# Patient Record
Sex: Female | Born: 1985 | Hispanic: Yes | Marital: Married
Health system: Southern US, Community
[De-identification: ages and names within clinical notes are randomized; demographics above are authoritative.]

---

## 2019-10-12 ENCOUNTER — Encounter (HOSPITAL_COMMUNITY): Payer: Self-pay | Admitting: Emergency Medicine

## 2019-10-12 ENCOUNTER — Emergency Department (HOSPITAL_COMMUNITY)
Admission: EM | Admit: 2019-10-12 | Discharge: 2019-10-12 | Disposition: A | Discharge status: Home / Self Care | Payer: HRSA Program | Attending: Emergency Medicine | Admitting: Emergency Medicine

## 2019-10-12 ENCOUNTER — Emergency Department (HOSPITAL_COMMUNITY): Payer: HRSA Program

## 2019-10-12 ENCOUNTER — Other Ambulatory Visit: Payer: Self-pay

## 2019-10-12 DIAGNOSIS — B349 Viral infection, unspecified: Secondary | ICD-10-CM

## 2019-10-12 DIAGNOSIS — U071 COVID-19: Secondary | ICD-10-CM | POA: Diagnosis not present

## 2019-10-12 DIAGNOSIS — R05 Cough: Secondary | ICD-10-CM | POA: Diagnosis present

## 2019-10-12 LAB — COMPREHENSIVE METABOLIC PANEL
ALT: 49 U/L — ABNORMAL HIGH (ref 0–44)
AST: 34 U/L (ref 15–41)
Albumin: 4.2 g/dL (ref 3.5–5.0)
Alkaline Phosphatase: 69 U/L (ref 38–126)
Anion gap: 7 (ref 5–15)
BUN: 15 mg/dL (ref 6–20)
CO2: 21 mmol/L — ABNORMAL LOW (ref 22–32)
Calcium: 9.1 mg/dL (ref 8.9–10.3)
Chloride: 109 mmol/L (ref 98–111)
Creatinine, Ser: 0.48 mg/dL (ref 0.44–1.00)
GFR calc Af Amer: >60 mL/min (ref >60)
GFR calc non Af Amer: >60 mL/min (ref >60)
Glucose, Bld: 94 mg/dL (ref 70–99)
Potassium: 3.8 mmol/L (ref 3.5–5.1)
Sodium: 137 mmol/L (ref 135–145)
Total Bilirubin: 1.9 mg/dL — ABNORMAL HIGH (ref 0.3–1.2)
Total Protein: 8.4 g/dL — ABNORMAL HIGH (ref 6.5–8.1)

## 2019-10-12 LAB — RESPIRATORY PANEL BY RT PCR (FLU A&B, COVID)
Influenza A by PCR: NEGATIVE
Influenza B by PCR: NEGATIVE
SARS Coronavirus 2 by RT PCR: POSITIVE — AB

## 2019-10-12 LAB — CBC WITH DIFFERENTIAL/PLATELET
Abs Immature Granulocytes: 0.02 10*3/uL (ref 0.00–0.07)
Basophils Absolute: 0 10*3/uL (ref 0.0–0.1)
Basophils Relative: 0 %
Eosinophils Absolute: 0.1 10*3/uL (ref 0.0–0.5)
Eosinophils Relative: 1 %
HCT: 41 % (ref 36.0–46.0)
Hemoglobin: 12.9 g/dL (ref 12.0–15.0)
Immature Granulocytes: 0 %
Lymphocytes Relative: 12 %
Lymphs Abs: 1.2 10*3/uL (ref 0.7–4.0)
MCH: 26.8 pg (ref 26.0–34.0)
MCHC: 31.5 g/dL (ref 30.0–36.0)
MCV: 85.2 fL (ref 80.0–100.0)
Monocytes Absolute: 0.6 10*3/uL (ref 0.1–1.0)
Monocytes Relative: 7 %
Neutro Abs: 7.8 10*3/uL — ABNORMAL HIGH (ref 1.7–7.7)
Neutrophils Relative %: 80 %
Platelets: 328 10*3/uL (ref 150–400)
RBC: 4.81 MIL/uL (ref 3.87–5.11)
RDW: 15 % (ref 11.5–15.5)
WBC: 9.7 10*3/uL (ref 4.0–10.5)
nRBC: 0 % (ref 0.0–0.2)

## 2019-10-12 MED ORDER — ONDANSETRON 4 MG PO TBDP: ORAL_TABLET | ORAL | 0 refills | Status: AC

## 2019-10-12 MED ORDER — SODIUM CHLORIDE 0.9 % IV BOLUS (SEPSIS)
1000.0000 mL | Freq: Once | INTRAVENOUS | Status: AC
  Administered 2019-10-12: 1000 mL via INTRAVENOUS

## 2019-10-12 MED ORDER — LOPERAMIDE HCL 2 MG PO CAPS
4.0000 mg | ORAL_CAPSULE | Freq: Once | ORAL | Status: AC
  Administered 2019-10-12: 11:00:00 4 mg via ORAL
  Filled 2019-10-12: qty 2

## 2019-10-12 MED ORDER — KETOROLAC TROMETHAMINE 30 MG/ML IJ SOLN
30.0000 mg | Freq: Once | INTRAMUSCULAR | Status: AC
  Administered 2019-10-12: 11:00:00 30 mg via INTRAVENOUS
  Filled 2019-10-12: qty 1

## 2019-10-12 NOTE — ED Notes (Addendum)
Pt complaint of abdominal pain with n/v/d, dry cough, sore throat, and headache. Pt also complaints of right great toe pain.

## 2019-10-12 NOTE — ED Notes (Signed)
Date and time results received: 10/12/19 3:05 PM  (use smartphrase ".now" to insert current time)  Test: COVID Critical Value: Positive  Name of Provider Notified: [facility]  Orders Received? Or Actions Taken?: Orders Received - See Orders for details

## 2019-10-12 NOTE — Discharge Instructions (Addendum)
Drink plenty of fluids.  Take Motrin for aches and pains.  And take Imodium to help with diarrhea,   you are positive for Covid and will need to quarantine for at least 7 days

## 2019-10-12 NOTE — ED Provider Notes (Signed)
[facility]-EMERGENCY DEPT Provider Note   CSN: [id number] Arrival date & time: 10/12/19  8466     History Chief Complaint  Patient presents with  . Multiple Complaints    Joanna Jacobs is a 34 y.o. female.  Patient complains of cough nausea vomiting diarrhea for 24 hours  The history is provided by the patient. No language interpreter was used.  Cough Cough characteristics:  Non-productive Sputum characteristics:  Nondescript Severity:  Mild Onset quality:  Sudden Timing:  Constant Progression:  Waxing and waning Chronicity:  New Smoker: no   Context: not animal exposure   Associated symptoms: no chest pain, no eye discharge, no headaches and no rash        History reviewed. No pertinent past medical history.  There are no problems to display for this patient.   History reviewed. No pertinent surgical history.   OB History   No obstetric history on file.     No family history on file.  Social History   Tobacco Use  . Smoking status: Not on file  Substance Use Topics  . Alcohol use: Not on file  . Drug use: Not on file    Home Medications Prior to Admission medications   Not on File    Allergies    Patient has no known allergies.  Review of Systems   Review of Systems  Constitutional: Negative for appetite change and fatigue.  HENT: Negative for congestion, ear discharge and sinus pressure.   Eyes: Negative for discharge.  Respiratory: Positive for cough.   Cardiovascular: Negative for chest pain.  Gastrointestinal: Positive for diarrhea. Negative for abdominal pain.  Genitourinary: Negative for frequency and hematuria.  Musculoskeletal: Negative for back pain.  Skin: Negative for rash.  Neurological: Negative for seizures and headaches.  Psychiatric/Behavioral: Negative for hallucinations.    Physical Exam Updated Vital Signs BP 126/69   Pulse 81   Temp 97.8 F (36.6 C) (Oral)   Resp 16   LMP  09/28/2019   SpO2 100%   Physical Exam Vitals and nursing note reviewed.  Constitutional:      Appearance: She is well-developed.  HENT:     Head: Normocephalic.     Nose: Nose normal.  Eyes:     General: No scleral icterus.    Conjunctiva/sclera: Conjunctivae normal.  Neck:     Thyroid: No thyromegaly.  Cardiovascular:     Rate and Rhythm: Normal rate and regular rhythm.     Heart sounds: No murmur. No friction rub. No gallop.   Pulmonary:     Breath sounds: No stridor. No wheezing or rales.  Chest:     Chest wall: No tenderness.  Abdominal:     General: There is no distension.     Tenderness: There is no abdominal tenderness. There is no rebound.  Musculoskeletal:        General: Normal range of motion.     Cervical back: Neck supple.  Lymphadenopathy:     Cervical: No cervical adenopathy.  Skin:    Findings: No erythema or rash.  Neurological:     Mental Status: She is alert and oriented to person, place, and time.     Motor: No abnormal muscle tone.     Coordination: Coordination normal.  Psychiatric:        Behavior: Behavior normal.     ED Results / Procedures / Treatments   Labs (all labs ordered are listed, but only abnormal results are displayed) Labs Reviewed  CBC WITH DIFFERENTIAL/PLATELET - Abnormal; Notable for the following components:      Result Value   Neutro Abs 7.8 (*)    All other components within normal limits  COMPREHENSIVE METABOLIC PANEL - Abnormal; Notable for the following components:   CO2 21 (*)    Total Protein 8.4 (*)    ALT 49 (*)    Total Bilirubin 1.9 (*)    All other components within normal limits  RESPIRATORY PANEL BY RT PCR (FLU A&B, COVID)    EKG None  Radiology DG Chest Port 1 View  Result Date: 10/12/2019 CLINICAL DATA:  Shortness of breath, abdominal pain EXAM: PORTABLE CHEST 1 VIEW COMPARISON:  None. FINDINGS: Heart and mediastinal contours are within normal limits. No focal opacities or effusions. No acute bony  abnormality. IMPRESSION: No active disease. Electronically Signed   By: Rolm Baptise M.D.   On: 10/12/2019 11:07    Procedures Procedures (including critical care time)  Medications Ordered in ED Medications  sodium chloride 0.9 % bolus 1,000 mL (0 mLs Intravenous Stopped 10/12/19 1200)  ketorolac (TORADOL) 30 MG/ML injection 30 mg (30 mg Intravenous Given 10/12/19 1050)  loperamide (IMODIUM) capsule 4 mg (4 mg Oral Given 10/12/19 1051)    ED Course  I have reviewed the triage vital signs and the nursing notes.  Pertinent labs & imaging results that were available during my care of the patient were reviewed by me and considered in my medical decision making (see chart for details). Joanna Jacobs was evaluated in Emergency Department on 10/13/2019 for the symptoms described in the history of present illness. She was evaluated in the context of the global COVID-19 pandemic, which necessitated consideration that the patient might be at risk for infection with the SARS-CoV-2 virus that causes COVID-19. Institutional protocols and algorithms that pertain to the evaluation of patients at risk for COVID-19 are in a state of rapid change based on information released by regulatory bodies including the CDC and federal and state organizations. These policies and algorithms were followed during the patient's care in the ED.    MDM Rules/Calculators/A&P                      Patient with viral syndrome.  Patient improved with fluids.   Patient's Covid test came back positive before she was discharged.  Patient was explained her results and put on quarantine Final Clinical Impression(s) / ED Diagnoses Final diagnoses:  None    Rx / DC Orders ED Discharge Orders    None       Milton Ferguson, MD 10/13/19 [phone]

## 2021-03-03 IMAGING — DX DG CHEST 1V PORT
1 series · 1 of 1 positions shown · non-contrast
Comparison: None.

CLINICAL DATA: Shortness of breath, abdominal pain

EXAM:
PORTABLE CHEST 1 VIEW

[chest ap]
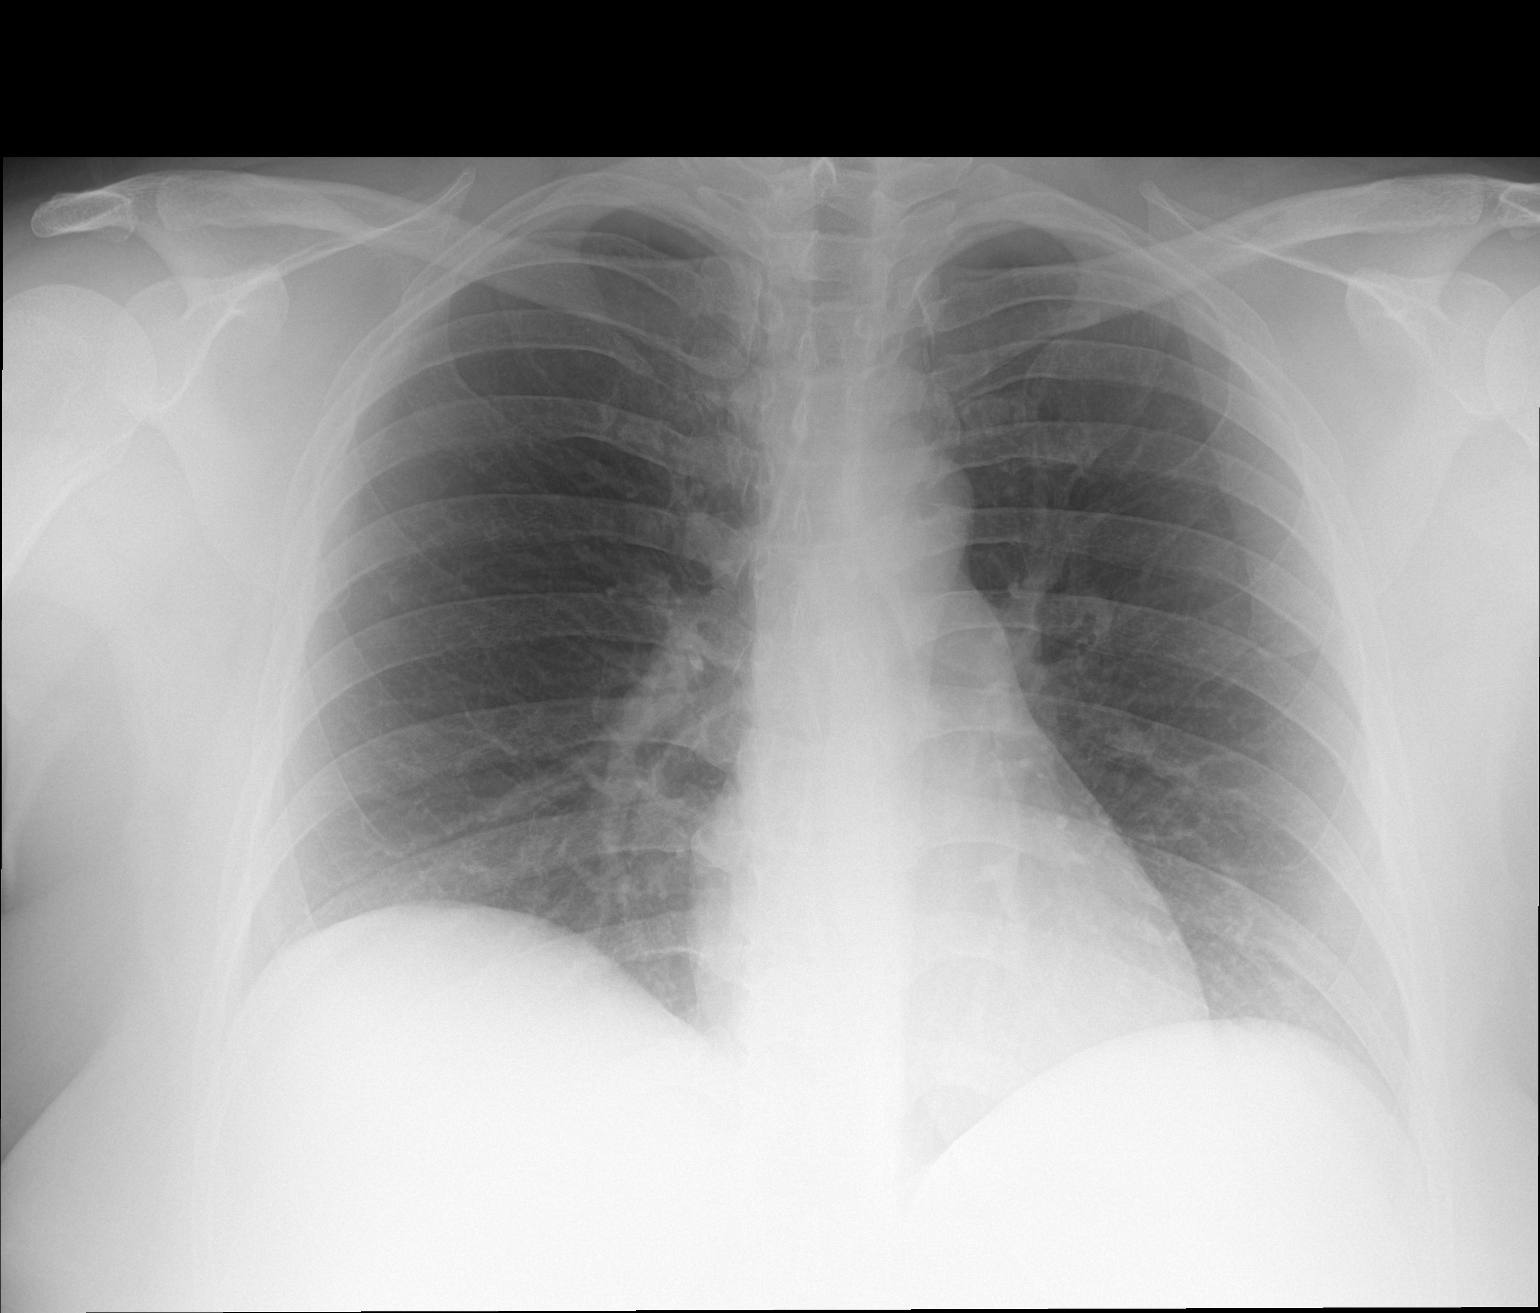

[1 of 1 positions shown; findings below may reference images not displayed]

FINDINGS: Heart and mediastinal contours are within normal limits. No focal
opacities or effusions. No acute bony abnormality.
IMPRESSION: No active disease.

## 2021-09-01 ENCOUNTER — Ambulatory Visit (INDEPENDENT_AMBULATORY_CARE_PROVIDER_SITE_OTHER): Payer: Self-pay | Admitting: Family Medicine

## 2021-09-01 ENCOUNTER — Ambulatory Visit: Payer: Self-pay

## 2021-09-01 ENCOUNTER — Other Ambulatory Visit: Payer: Self-pay

## 2021-09-01 VITALS — BP 142/80 | Ht 61.0 in | Wt 210.0 lb

## 2021-09-01 DIAGNOSIS — M25571 Pain in right ankle and joints of right foot: Secondary | ICD-10-CM

## 2021-09-01 NOTE — Progress Notes (Addendum)
° ° °  SUBJECTIVE:   CHIEF COMPLAINT / HPI:   Ankle pain worsening over the last couple days.  Had injury where she was sledding downhill with her daughter.  Evidently the sled was out of control and she ended up sliding into some type of barrier taking most of the impact on her right foot.  Immediate onset of pain.  Was seen at outside urgent care facility where they did x-rays and apparently she was told they were negative.  They gave her crutches but did not place her in boot.  She continues to be unable to bear weight.  PERTINENT  PMH / PSH: No history of ankle surgeries.  OBJECTIVE:   BP (!) 142/80    Ht 5\' 1"  (1.549 m)    Wt 210 lb (95.3 kg)    BMI 39.68 kg/m    General: Alert, no acute distress Ankle: Inspection: No visible erythema or swelling. Palpation: No pain at base of 5th MT; No tenderness over cuboid; No tenderness over N spot or navicular prominence No tenderness on posterior aspects of lateral and medial malleolus Talar dome nontender; Range of motion is full in all directions. Strength is 5/5 in all directions. Stable lateral and medial ligaments; squeeze test and kleiger test unremarkable; No significant hypermobility in testing ankle ligaments No sign of peroneal tendon subluxations; Negative tarsal tunnel tinel's Able to walk 4 steps. No Tinel's or positive signs of loss of sensation over the foot or ankle    ASSESSMENT/PLAN:   No problem-specific Assessment & Plan notes found for this encounter.     Carollee Leitz, MD [place]

## 2021-09-01 NOTE — Patient Instructions (Signed)
Thank you for coming to see me today. It was a pleasure.   You have an anterior talar ligament tear that is causing pain and swelling.  This is the most common type of ligament injury.  You will need to stay in the boot for 2 weeks and use crutches.  No weight bearing.  Can remove boot at night to sleep  Can take Ibuprofen for pain as needed.  Please follow-up with Dr Nori Riis in 2 weeks  If you have any questions or concerns.  Best,   Carollee Leitz, MD

## 2021-09-02 ENCOUNTER — Encounter: Payer: Self-pay | Admitting: Family Medicine

## 2021-09-02 DIAGNOSIS — M25571 Pain in right ankle and joints of right foot: Secondary | ICD-10-CM | POA: Insufficient documentation

## 2021-09-02 NOTE — Progress Notes (Addendum)
[facility] Attending Note: I have seen and examined this patient. I have discussed this patient with the resident and reviewed the history and exam.  I also performed exam and ultrasound:  Ultrasound Right ankle.  Ankle mortise appears intact without any sign of bony defect.  Small effusion noted in the anterior approach.  Anterior talofibular ligament is not seen well at all and is likely disrupted.  There is significant amount of fluid in the tendon sheath of the extensor digitorum longus and peroneal tendons.  Tendons  appear intact.     Assessment: Severe ankle sprain, most likely complete anterior talofibular ligament tear PLAN: Ultrasound revealed some mild joint effusion but a lot of fluid in the tendon sheaths.  Given the mechanism of injury which included severe impact to the heel and axial plane, would be concern for OCD lesion.  She had some x-rays initially but they were done at an outside facility we do not have access to them.  Her husband says that he was told they were negative for fracture.  We discussed options including treatment now with immobilization in cam walker boot and touchdown weightbearing only.  We instructed her on how to use her crutches and she had obtained a pair of crutches from the ED but evidently was not using them correctly.  Unfortunately she did not have them with her today.  She will have to be out of work for the next 2 to 3 weeks.  Letter given out of work until we see her back.  Considered evaluation for OCD lesion.  Do not think it would change management at this point but will reevaluate when we see her back.  Certainly she is having significant and severe pain and unable to bear weight.  Interpreter used for entire visit all questions from patient and her husband were answered.  They are amenable to plan.

## 2021-09-15 ENCOUNTER — Ambulatory Visit: Payer: Medicaid Other | Admitting: Family Medicine

## 2021-09-18 ENCOUNTER — Ambulatory Visit (INDEPENDENT_AMBULATORY_CARE_PROVIDER_SITE_OTHER): Payer: Managed Care, Other (non HMO) | Admitting: Sports Medicine

## 2021-09-18 VITALS — BP 122/90 | Ht 61.0 in | Wt 210.0 lb

## 2021-09-18 DIAGNOSIS — M25571 Pain in right ankle and joints of right foot: Secondary | ICD-10-CM | POA: Diagnosis not present

## 2021-09-18 NOTE — Patient Instructions (Addendum)
It was great seeing you today!  I am sorry you are still in pain. We are going to get an X ray and MRI of your ankle. Please go to the [facility] when you leave here to get the x-ray done. You don't need an appt. Their address is [address] [place].  Schedule your MRI while you are getting your x-rays done, or you can call [phone].  We will give you a note to keep you out of work until after you follow up with [place] for your MRI results. Once you have your MRI scheduled, call our office to schedule a follow up appt to go over results.   Ask your employer about FMLA paperwork since you are going to be out for an extended period of time.  Please call with any questions in the meantime.   Fue genial verte hoy!  Lamento que todava tengas dolor. Vamos a obtener una radiografa y [place] resonancia magntica de su tobillo. Joanna Jacobs al [facility] cuando salga de aqu para hacerse la radiografa. No necesitas una cita. Su direccin es [address] [place].  Programe su MRI [facility] [place], o puede llamar al [phone].  Le daremos una nota para mantenerlo sin trabajo hasta despus de que se comunique con nosotros para obtener los [place] de su MRI. Una vez que haya programado su resonancia magntica, llame a [place] oficina para programar una cita de seguimiento para revisar los [place].  Pregntele a su empleador sobre el papeleo de FMLA ya que estar fuera por un perodo prolongado.  Por favor llame con cualquier pregunta mientras tanto.

## 2021-09-18 NOTE — Progress Notes (Deleted)
PCP: Drue Flirt, MD  Subjective:   HPI: Patient is a 36 y.o. female here for ***.  Patient was seen on 1/27 for ankle pain and was found to have severe ankle sprain with likely complete anterior talofibular ligament tear with concern for Osteochondritis dissecans . XR done prior done at outside facility but was told they were normal.   Ankle feeling the same        Objective:  Physical Exam:  Gen: NAD, comfortable in exam room  *** Foot: Inspection:  No obvious bony deformity b/l.  No swelling, erythema, or bruising b/l.  Normal arch b/l Palpation: No tenderness to palpation b/l ROM: Full  ROM of the ankle b/l. Normal midfoot flexibility b/l Strength: 5/5 strength ankle in all planes b/l Neurovascular: N/V intact distally in the lower extremity b/l Special tests: Negative anterior drawer. Negative squeeze. normal midfoot flexibility. Normal calcaneal motion with heel raise    Assessment & Plan:  1. ***

## 2021-09-18 NOTE — Progress Notes (Addendum)
PCP: Drue Flirt, MD  Subjective:   HPI: Patient is a 36 y.o. female here for follow up on her ankle pain. Phone interpreter used for entirety of visit.   Patient was seen on 1/27 for ankle pain after rolling her right ankle while snowboarding, and was found to have severe ankle sprain with likely complete anterior talofibular ligament tear with concern for Osteochondritis dissecans . XR done prior to this visit was performed at outside facility but she was told they were normal.   Today she reports ankle still feeling the same and finds it very painful to bear any weight on her foot. She has been using a boot and crutches. She endorses swelling that has been improving. States she has been Publishing copy which she feels is helpful for the swelling. She has been out of work and asks if she should return to work. 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 She states if she will be out of work she will need documentation.       Objective:  Physical Exam:  Gen: NAD, comfortable in exam room  R Foot: Inspection:  No obvious bony deformity.  No erythema, or bruising though there is significant swelling  Palpation: tenderness to palpation diffusely around medial and lateral malleoli  ROM: Significantly decreased ROM of the ankle and midfoot flexibility  Strength: Reduced strength of ankle in all planes  Neurovascular: N/V intact distally in the lower extremity    Assessment & Plan:  1. Right ankle sprain  Patient was seen on 1/27 and was found to have severe ankle sprain with likely complete anterior talofibular ligament tear. An X ray was not done at that time but was performed prior to visit at an urgent care which patient states was normal but we do not have access to the images.Considering she still is experiencing significant pain and edema and still not able to bear weight will obtain an X ray and MRI of ankle. Additionally, will write a  note excusing her from work until MRI results return. Advised her to ask her employer about FMLA paperwork since she is going to be out for an extended period of time. Patient agreeable with plan.  Patient seen and evaluated with the resident.  I agree with the above plan of care.  Although this patient was told that her original x-rays were unremarkable, I do not have access to review those.  She is still having significant pain and swelling a month after her injury.  Going to start with getting updated x-rays of her ankle.  If unremarkable, we will proceed with MRI to evaluate further.  In the meantime, patient will remain out of work until follow-up with me after her MRI.  Addendum: X-ray reviewed.  No obvious fracture.  There is obvious soft tissue swelling.  Proceed with MRI as scheduled to evaluate further.

## 2021-09-19 ENCOUNTER — Ambulatory Visit
Admission: RE | Admit: 2021-09-19 | Discharge: 2021-09-19 | Disposition: A | Discharge status: Home / Self Care | Payer: Managed Care, Other (non HMO) | Source: Ambulatory Visit | Attending: Sports Medicine | Admitting: Sports Medicine

## 2021-09-19 DIAGNOSIS — M25571 Pain in right ankle and joints of right foot: Secondary | ICD-10-CM

## 2021-09-23 ENCOUNTER — Ambulatory Visit
Admission: RE | Admit: 2021-09-23 | Discharge: 2021-09-23 | Disposition: A | Discharge status: Home / Self Care | Payer: Managed Care, Other (non HMO) | Source: Ambulatory Visit | Attending: Sports Medicine | Admitting: Sports Medicine

## 2021-09-23 ENCOUNTER — Other Ambulatory Visit: Payer: Self-pay

## 2021-09-23 DIAGNOSIS — M25571 Pain in right ankle and joints of right foot: Secondary | ICD-10-CM

## 2021-09-26 ENCOUNTER — Ambulatory Visit (INDEPENDENT_AMBULATORY_CARE_PROVIDER_SITE_OTHER): Payer: Managed Care, Other (non HMO) | Admitting: Sports Medicine

## 2021-09-26 ENCOUNTER — Other Ambulatory Visit: Payer: Self-pay

## 2021-09-26 VITALS — BP 130/92 | Ht 61.0 in | Wt 210.0 lb

## 2021-09-26 DIAGNOSIS — M25571 Pain in right ankle and joints of right foot: Secondary | ICD-10-CM

## 2021-09-26 DIAGNOSIS — S93491D Sprain of other ligament of right ankle, subsequent encounter: Secondary | ICD-10-CM

## 2021-09-26 NOTE — Progress Notes (Signed)
° °  Subjective:    Patient ID: Joanna Jacobs, female    DOB: Jun 15, 1986, 36 y.o.   MRN: [id number]  HPI  Patient presents today to discuss MRI findings of her right ankle.  MRI shows findings consistent with a severe lateral ankle sprain.  No evidence of osteochondral injury.  Overall, she is feeling better.  She does endorse some persistent pain with range of motion and with walking but it is improving.  She is anxious to return to work and believes that she is able to perform her job without restrictions.   Review of Systems As above    Objective:   Physical Exam  Well-developed, well-nourished.  No acute distress  Right ankle: Previous soft tissue swelling has resolved.  No effusion.  She has excellent range of motion.  Still tender to palpation over the ATF with a slightly positive anterior drawer.  Positive talar tilt.  Good pulses.  She is able to ambulate much better than on her previous office visit with only a minimal limp.  MRI of her right ankle as above      Assessment & Plan:   Right ankle pain secondary to lateral ankle sprain-improving  Patient would like to return to work without restrictions.  I given her a note allowing her to return to work on February 27.  She will need to wear an ASO on her ankle to help provide support and I will also refer her for physical therapy.  She will wean from formal physical therapy to a home exercise program per the therapist discretion.  I would like for this patient to make a full recovery.  She will follow-up for ongoing or recalcitrant issues.  This note was dictated using Dragon naturally speaking software and may contain errors in syntax, spelling, or content which have not been identified prior to signing this note.

## 2023-02-13 IMAGING — MR MR ANKLE*R* W/O CM
5 series · 37 of 40 positions shown · non-contrast
Comparison: None.

CLINICAL DATA: Pain at the top of the ankle with swelling. Injured
while skiing.

EXAM:
MRI OF THE RIGHT ANKLE WITHOUT CONTRAST
TECHNIQUE: Multiplanar, multisequence MR imaging of the ankle was performed. No
intravenous contrast was administered.

[Series 4: T2 fat-sat · axial · 3.0mm · 0.50mm/px · z∈[-81,+39]mm · 9 of 32 slices shown (1 of 2)]
[im 1/32]
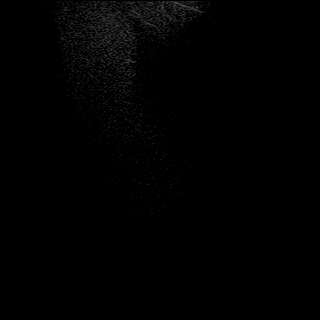
[im 4/32]
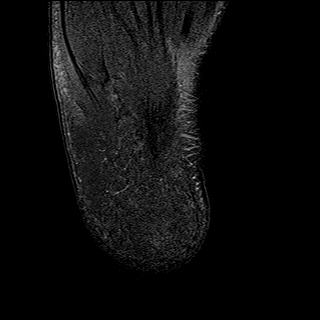
[im 8/32]
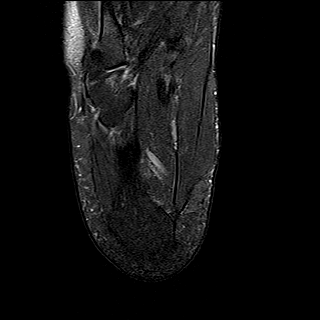
[im 12/32]
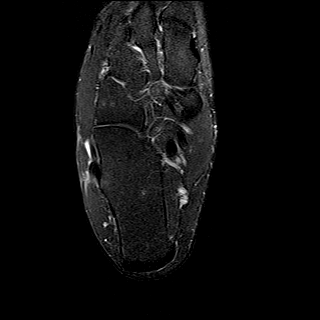
[im 16/32]
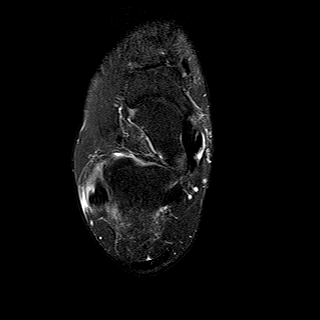
[im 20/32]
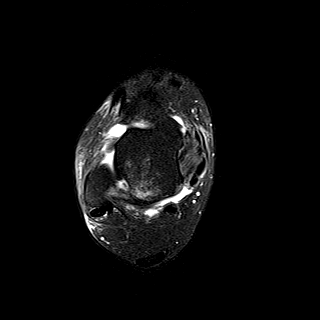
[im 24/32]
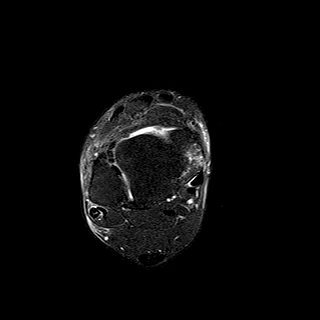
[im 28/32]
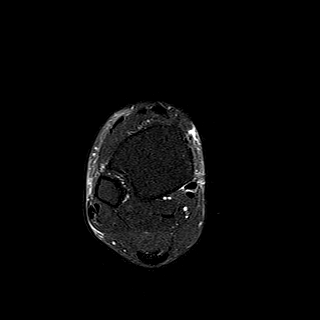
[im 32/32]
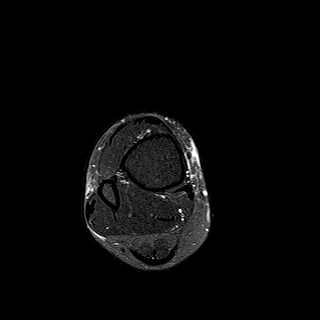

[Series 5: PD fat-sat · axial · 3.0mm · 0.50mm/px · z∈[-81,+39]mm · 9 of 32 slices shown]
[im 1/32]
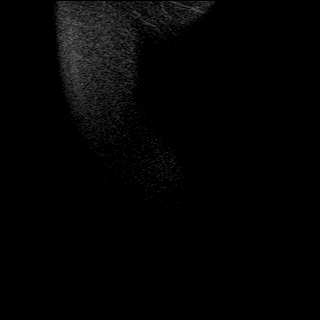
[im 4/32]
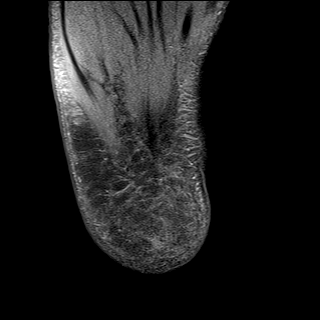
[im 8/32]
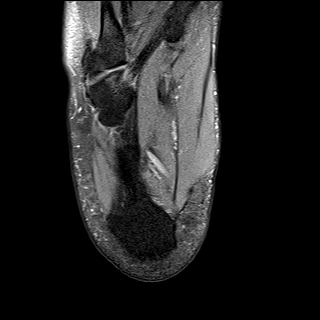
[im 12/32]
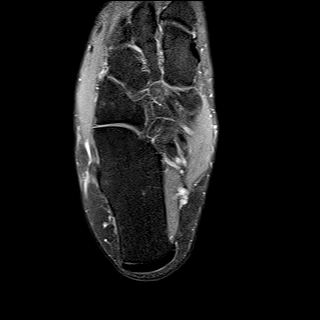
[im 16/32]
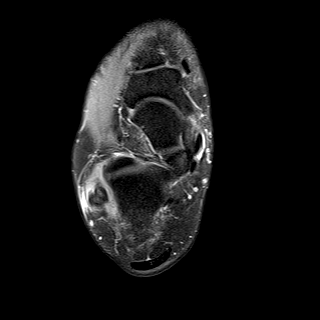
[im 20/32]
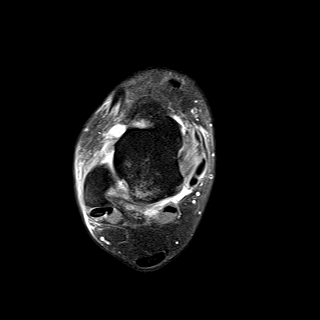
[im 24/32]
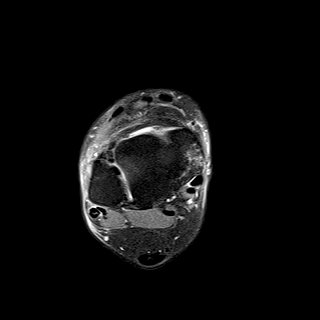
[im 28/32]
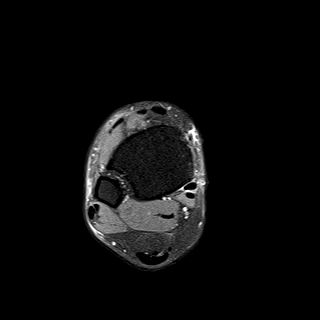
[im 32/32]
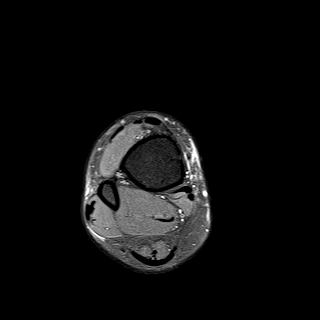

[Series 6: T1 · sagittal · 4.0mm · 0.56mm/px · 6 of 22 slices shown]
[im 1/22]
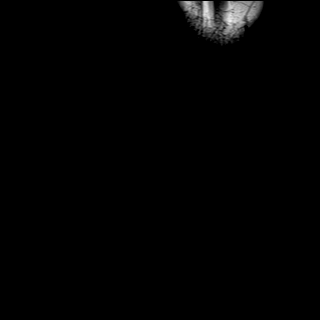
[im 5/22]
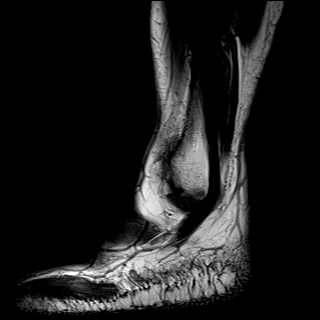
[im 9/22]
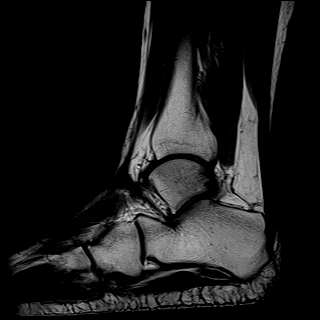
[im 13/22]
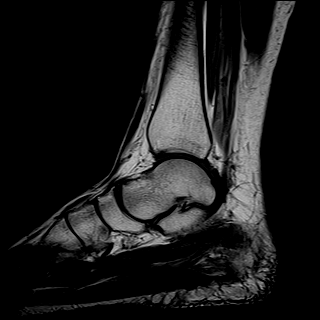
[im 17/22]
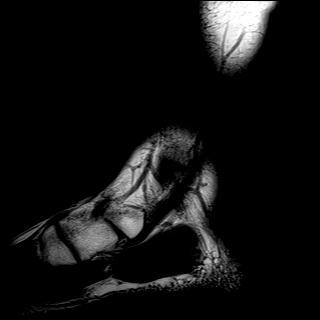
[im 22/22]
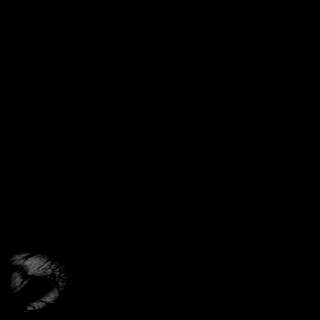

[Series 7: STIR · sagittal · 4.0mm · 0.35mm/px · 5 of 22 slices shown]
[im 1/22]
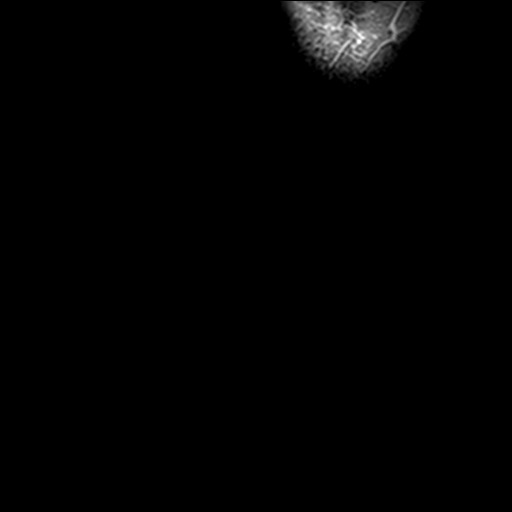
[im 5/22]
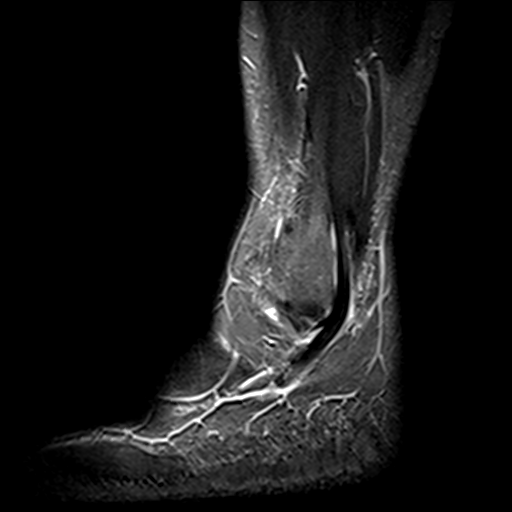
[im 9/22]
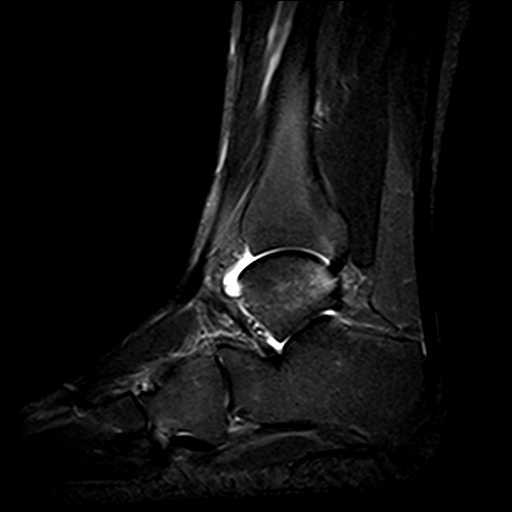
[im 13/22]
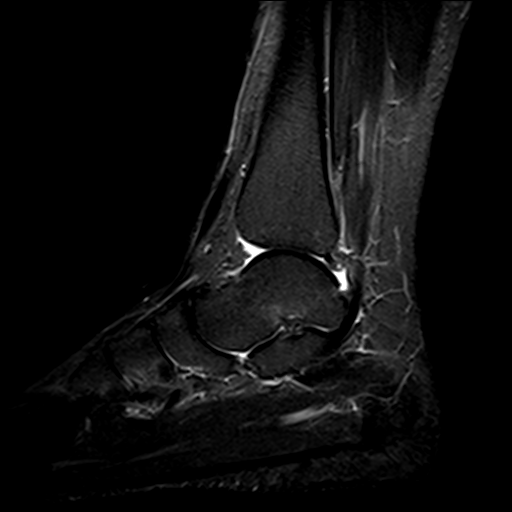
[im 17/22]
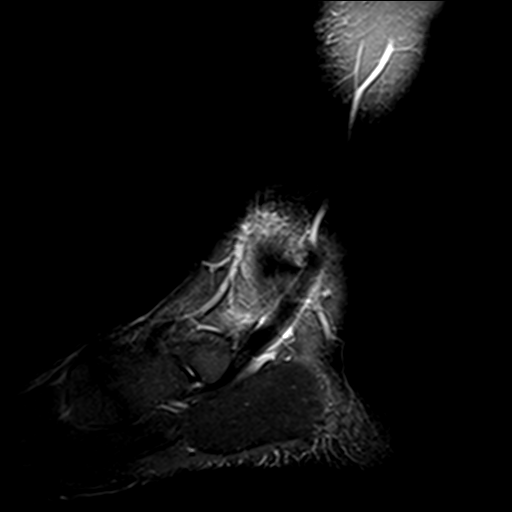

[Series 8: T2 fat-sat · coronal · 3.0mm · 0.50mm/px · 8 of 35 slices shown (2 of 2)]
[im 1/35]
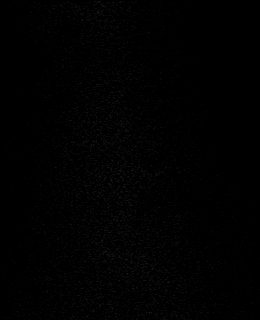
[im 4/35]
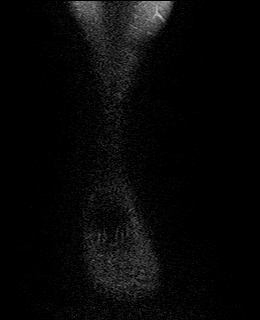
[im 12/35]
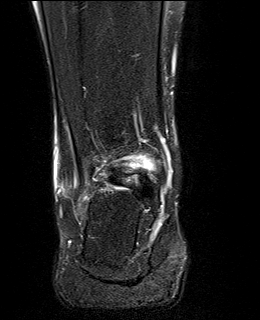
[im 16/35]
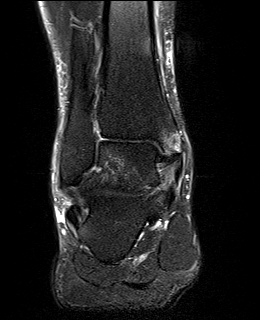
[im 19/35]
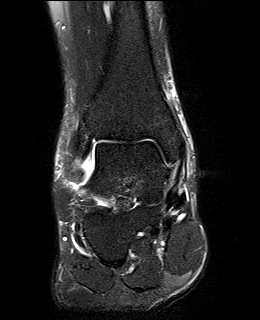
[im 23/35]
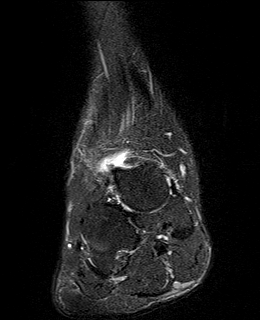
[im 31/35]
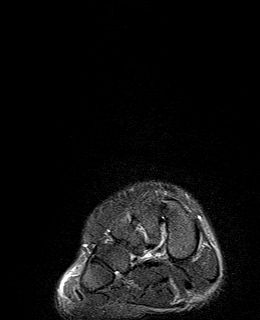
[im 35/35]
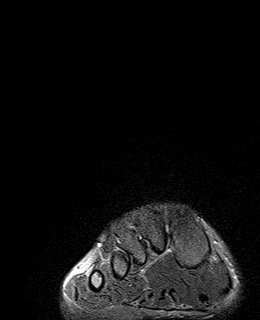

[37 of 40 positions shown; findings below may reference images not displayed]

FINDINGS: TENDONS

Peroneal: Moderate tendinosis of the peroneus longus with an
interstitial tear at the level of the lateral malleolus. Mild
tendinosis of the peroneus brevis.

Posteromedial: Mild tenosynovitis of the posterior tibial tendon .
Flexor hallucis longus tendon intact. Flexor digitorum longus tendon
intact.

Anterior: Tibialis anterior tendon intact. Extensor hallucis longus
tendon intact Extensor digitorum longus tendon intact.

Achilles:  Intact.

Plantar Fascia: Intact.

LIGAMENTS

Lateral: Severe thickening of the anterior talofibular ligament
consistent with ligament strain and a partial-thickness tear.
Complete tear of the calcaneofibular ligament. Posterior talofibular
ligament intact. Anterior and posterior tibiofibular ligaments
intact.

Medial: Deltoid ligament intact. Spring ligament intact.

CARTILAGE

Ankle Joint: No chondral defect.  Moderate ankle joint effusion.

Subtalar Joints/Sinus Tarsi: Normal subtalar joints. No subtalar
joint effusion. Normal sinus tarsi.

Bones: Osseous contusion of the posterolateral talar dome, medial
malleolus and anterior aspect of the lateral malleolus. No acute
fracture or dislocation. Mild osteoarthritis of the talonavicular
joint.

Soft Tissue: No fluid collection or hematoma. Muscles are normal
without edema or atrophy. Tarsal tunnel is normal.
IMPRESSION: 1. Severe thickening of the anterior talofibular ligament consistent
with ligament strain and a partial-thickness tear. Complete tear of
the calcaneofibular ligament. Osseous contusion of the
posterolateral talar dome, medial malleolus and anterior aspect of
the lateral malleolus. Overall findings can be seen in the setting
of an inversion injury.
2. Moderate tendinosis of the peroneus longus with an interstitial
tear at the level of the lateral malleolus.
3. Mild tendinosis of the peroneus brevis.
4. Mild tenosynovitis of the posterior tibial tendon.
